# Patient Record
Sex: Female | Born: 1935 | Race: Black or African American | Hispanic: No | Smoking: Never smoker
Health system: Southern US, Community
[De-identification: ages and names within clinical notes are randomized; demographics above are authoritative.]

## PROBLEM LIST (undated history)

## (undated) DIAGNOSIS — E119 Type 2 diabetes mellitus without complications: Secondary | ICD-10-CM

## (undated) DIAGNOSIS — R569 Unspecified convulsions: Secondary | ICD-10-CM

## (undated) DIAGNOSIS — I10 Essential (primary) hypertension: Secondary | ICD-10-CM

## (undated) DIAGNOSIS — I639 Cerebral infarction, unspecified: Secondary | ICD-10-CM

## (undated) DIAGNOSIS — G8324 Monoplegia of upper limb affecting left nondominant side: Secondary | ICD-10-CM

---

## 2013-08-07 ENCOUNTER — Emergency Department (HOSPITAL_COMMUNITY): Payer: Medicare Other

## 2013-08-07 ENCOUNTER — Emergency Department (HOSPITAL_COMMUNITY)
Admission: EM | Admit: 2013-08-07 | Discharge: 2013-08-07 | Disposition: A | Discharge status: Home / Self Care | Payer: Medicare Other | Attending: Emergency Medicine | Admitting: Emergency Medicine

## 2013-08-07 ENCOUNTER — Encounter (HOSPITAL_COMMUNITY): Payer: Self-pay | Admitting: Emergency Medicine

## 2013-08-07 DIAGNOSIS — R569 Unspecified convulsions: Secondary | ICD-10-CM | POA: Diagnosis present

## 2013-08-07 DIAGNOSIS — Z8669 Personal history of other diseases of the nervous system and sense organs: Secondary | ICD-10-CM | POA: Insufficient documentation

## 2013-08-07 DIAGNOSIS — I10 Essential (primary) hypertension: Secondary | ICD-10-CM | POA: Diagnosis not present

## 2013-08-07 DIAGNOSIS — N390 Urinary tract infection, site not specified: Secondary | ICD-10-CM | POA: Insufficient documentation

## 2013-08-07 DIAGNOSIS — Z8673 Personal history of transient ischemic attack (TIA), and cerebral infarction without residual deficits: Secondary | ICD-10-CM | POA: Diagnosis not present

## 2013-08-07 DIAGNOSIS — E119 Type 2 diabetes mellitus without complications: Secondary | ICD-10-CM | POA: Diagnosis not present

## 2013-08-07 HISTORY — DX: Type 2 diabetes mellitus without complications: E11.9

## 2013-08-07 HISTORY — DX: Monoplegia of upper limb affecting left nondominant side: G83.24

## 2013-08-07 HISTORY — DX: Cerebral infarction, unspecified: I63.9

## 2013-08-07 HISTORY — DX: Unspecified convulsions: R56.9

## 2013-08-07 HISTORY — DX: Essential (primary) hypertension: I10

## 2013-08-07 LAB — URINALYSIS, ROUTINE W REFLEX MICROSCOPIC
BILIRUBIN URINE: NEGATIVE
Glucose, UA: NEGATIVE mg/dL
Hgb urine dipstick: NEGATIVE
KETONES UR: NEGATIVE mg/dL
Leukocytes, UA: NEGATIVE
Nitrite: POSITIVE — AB
Protein, ur: NEGATIVE mg/dL
Specific Gravity, Urine: 1.02 (ref 1.005–1.030)
UROBILINOGEN UA: 0.2 mg/dL (ref 0.0–1.0)
pH: 6.5 (ref 5.0–8.0)

## 2013-08-07 LAB — CBC WITH DIFFERENTIAL/PLATELET
Basophils Absolute: 0 10*3/uL (ref 0.0–0.1)
Basophils Relative: 0 % (ref 0–1)
Eosinophils Absolute: 0 10*3/uL (ref 0.0–0.7)
Eosinophils Relative: 0 % (ref 0–5)
HEMATOCRIT: 35.4 % — AB (ref 36.0–46.0)
HEMOGLOBIN: 11.6 g/dL — AB (ref 12.0–15.0)
Lymphocytes Relative: 20 % (ref 12–46)
Lymphs Abs: 0.7 10*3/uL (ref 0.7–4.0)
MCH: 28.6 pg (ref 26.0–34.0)
MCHC: 32.8 g/dL (ref 30.0–36.0)
MCV: 87.2 fL (ref 78.0–100.0)
MONOS PCT: 8 % (ref 3–12)
Monocytes Absolute: 0.3 10*3/uL (ref 0.1–1.0)
NEUTROS ABS: 2.6 10*3/uL (ref 1.7–7.7)
NEUTROS PCT: 72 % (ref 43–77)
Platelets: 149 10*3/uL — ABNORMAL LOW (ref 150–400)
RBC: 4.06 MIL/uL (ref 3.87–5.11)
RDW: 15.2 % (ref 11.5–15.5)
WBC: 3.7 10*3/uL — ABNORMAL LOW (ref 4.0–10.5)

## 2013-08-07 LAB — COMPREHENSIVE METABOLIC PANEL
ALT: 13 U/L (ref 0–35)
ANION GAP: 9 (ref 5–15)
AST: 20 U/L (ref 0–37)
Albumin: 2.8 g/dL — ABNORMAL LOW (ref 3.5–5.2)
Alkaline Phosphatase: 84 U/L (ref 39–117)
BUN: 19 mg/dL (ref 6–23)
CALCIUM: 8.4 mg/dL (ref 8.4–10.5)
CO2: 27 mEq/L (ref 19–32)
Chloride: 110 mEq/L (ref 96–112)
Creatinine, Ser: 0.51 mg/dL (ref 0.50–1.10)
GFR calc non Af Amer: 89 mL/min — ABNORMAL LOW (ref >90)
GLUCOSE: 89 mg/dL (ref 70–99)
Potassium: 3.9 mEq/L (ref 3.7–5.3)
SODIUM: 146 meq/L (ref 137–147)
TOTAL PROTEIN: 6.8 g/dL (ref 6.0–8.3)
Total Bilirubin: <0.2 mg/dL — ABNORMAL LOW (ref 0.3–1.2)

## 2013-08-07 LAB — PROTIME-INR
INR: 1.15 (ref 0.00–1.49)
PROTHROMBIN TIME: 14.7 s (ref 11.6–15.2)

## 2013-08-07 LAB — LACTIC ACID, PLASMA: Lactic Acid, Venous: 1.1 mmol/L (ref 0.5–2.2)

## 2013-08-07 LAB — URINE MICROSCOPIC-ADD ON

## 2013-08-07 MED ORDER — SODIUM CHLORIDE 0.9 % IV SOLN
1000.0000 mL | Freq: Once | INTRAVENOUS | Status: AC
Start: 2013-08-07 — End: 2013-08-07
  Administered 2013-08-07: 1000 mL via INTRAVENOUS

## 2013-08-07 MED ORDER — SODIUM CHLORIDE 0.9 % IV SOLN
1000.0000 mL | INTRAVENOUS | Status: DC
  Administered 2013-08-07: 1000 mL via INTRAVENOUS

## 2013-08-07 MED ORDER — DEXTROSE 5 % IV SOLN
1.0000 g | Freq: Once | INTRAVENOUS | Status: AC
  Administered 2013-08-07: 1 g via INTRAVENOUS
  Filled 2013-08-07: qty 10

## 2013-08-07 MED ORDER — CEPHALEXIN 500 MG PO CAPS: 500.0000 mg | ORAL_CAPSULE | Freq: Two times a day (BID) | ORAL | Status: AC

## 2013-08-07 NOTE — ED Notes (Signed)
Pt had witnessed seizure at funeral today per family. Pt post ictal upon arrival to ED.

## 2013-08-07 NOTE — ED Notes (Signed)
Pt niece, Alberteen SpindleMargie Williamson [phone].  Pt niece, Johnsie KindredMattie Beckham [phone].

## 2013-08-07 NOTE — Discharge Instructions (Signed)
As discussed, it is important that you follow up as soon as possible with your physician for continued management of your condition. ° °If you develop any new, or concerning changes in your condition, please return to the emergency department immediately. ° °

## 2013-08-07 NOTE — ED Provider Notes (Signed)
CSN: [id number]     Arrival date & time 08/07/13  1559 History   First MD Initiated Contact with Patient 08/07/13 1619    This chart was scribed for Gerhard Munchobert Calil Amor, MD by Marica OtterNusrat Rahman, ED Scribe. This patient was seen in room [facility]18/[facility]18 and the patient's care was started at 4:30 PM.   LEVEL 5 CAVEAT: POSTICTAL STATE Chief Complaint  Patient presents with  . Seizures   [place]  The history is provided by a relative. No language interpreter was used.   HPI Comments: Jaime Bradley is a 80 y.o. female, with a medical Hx significant for stroke (with left sided weakness), DM, HTN, seizure, and paralysis of LUE, who presents to the Emergency Department complaining of 2 seizures that took place earlier today. Pt is postictal at time of examination.   Per relatives, pt attended her sister's funeral today. Relative reports that pt had her first seizure while getting ready to attend the funeral and her second seizure while at the funeral. Relatives deny any head trauma during either one of the episodes.   Pt's relatives report that she has a Hx of seizures, with the last seizure taking place in January of 2015. Relatives note, however, that she is not on any epilepsy meds. Relatives further note that pt has been using a wheelchair at baseline for the past 5 years. Relatives note prior to her 2 epileptic episodes today, she was at baseline and they note that she ate breakfast this morning.    Past Medical History  Diagnosis Date  . Stroke   . Hypertension   . Diabetes mellitus without complication   . Seizure   . Paralysis of left upper extremity    History reviewed. No pertinent past surgical history. No family history on file. History  Substance Use Topics  . Smoking status: Never Smoker   . Smokeless tobacco: Not on file  . Alcohol Use: No   OB History   Grav Para Term Preterm Abortions TAB SAB Ect Mult Living                 Review of Systems  Unable to  perform ROS: Acuity of condition      Allergies  Review of patient's allergies indicates no known allergies.  Home Medications   Prior to Admission medications   Not on File   Triage Vitals: BP 167/105  Pulse 96  Temp(Src) 97.8 F (36.6 C) (Oral)  Resp 20  SpO2 98% Physical Exam  Nursing note and vitals reviewed. Constitutional: She appears well-developed and well-nourished.  HENT:  Head: Normocephalic and atraumatic.  Eyes: Conjunctivae and EOM are normal.  Cardiovascular: Normal rate and regular rhythm.   Pulmonary/Chest: Effort normal and breath sounds normal. No stridor. No respiratory distress.  Abdominal: She exhibits no distension. There is no tenderness.  Musculoskeletal: She exhibits no edema.  Neurological:  Patient is initially postictal, not responding. Subsequently the patient is awake, alert, interactive, appropriately according to family members. Patient has lower extremity paresis, unchanged from prior stroke  Skin: Skin is warm and dry.  Psychiatric: Cognition and memory are impaired.    ED Course  Procedures (including critical care time) 6:30PM: Recheck: pt is resting comfortably, discussed UA results with pt.   6:59PM: Discussion with pt's family regarding lab results, starting antibiotics and reasons for holding off on epilepsy meds for now. Patient's family verbalizes understanding and agrees with treatment plan. Pt's family further advised to f/u with PCP as soon  as possible.   Labs Review Labs Reviewed  CBC WITH DIFFERENTIAL - Abnormal; Notable for the following:    WBC 3.7 (*)    Hemoglobin 11.6 (*)    HCT 35.4 (*)    Platelets 149 (*)    All other components within normal limits  COMPREHENSIVE METABOLIC PANEL - Abnormal; Notable for the following:    Albumin 2.8 (*)    Total Bilirubin <0.2 (*)    GFR calc non Af Amer 89 (*)    All other components within normal limits  URINALYSIS, ROUTINE W REFLEX MICROSCOPIC - Abnormal; Notable for the  following:    APPearance HAZY (*)    Nitrite POSITIVE (*)    All other components within normal limits  URINE MICROSCOPIC-ADD ON - Abnormal; Notable for the following:    Bacteria, UA MANY (*)    All other components within normal limits  LACTIC ACID, PLASMA  PROTIME-INR    Imaging Review Dg Chest 1 View  08/07/2013   CLINICAL DATA:  Weakness and seizures  EXAM: CHEST - 1 VIEW  COMPARISON:  None.  FINDINGS: The lungs are borderline hypoinflated but clear. The heart and mediastinal structures are within the limits of normal. There is mild tortuosity of the descending thoracic aorta. There is levoscoliosis of the thoracolumbar spine which may be positional.  IMPRESSION: There is no acute cardiopulmonary abnormality.   Electronically Signed   By: David  Swazil[place]   On: 08/07/2013 17:44   Ct Head Wo Contrast  08/07/2013   CLINICAL DATA:  Altered level of consciousness  EXAM: CT HEAD WITHOUT CONTRAST  TECHNIQUE: Contiguous axial images were obtained from the base of the skull through the vertex without intravenous contrast.  COMPARISON:  None.  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 There is chronic infarction with atrophy and encephalomalacia in the right middle cerebral artery territory. There is ex vacuo enlargement of the right lateral ventricle related to this. No sign of acute infarction, mass lesion, hemorrhage, hydrocephalus or extra-axial collection. No acute calvarial finding. Sinuses are clear.  IMPRESSION: Previous right-sided craniotomy for aneurysm clipping. Chronic infarction in the right middle cerebral artery territory. No acute finding.   Electronically Signed   By: Paulina FusiMark  Shogry M.D.   On: 08/07/2013 17:20     EKG Interpretation   Date/Time:  Friday August 07 2013 16:05:38 EDT Ventricular Rate:  93 PR Interval:    QRS Duration: 85 QT Interval:  472 QTC Calculation: 587 R Axis:   -3 Text  Interpretation:  Atrial flutter with predominant 3:1 AV block  Nonspecific T abnormalities, lateral leads Prolonged QT interval Artifact  Abnormal ekg Confirmed by Gerhard MunchLOCKWOOD, Haven Pylant  MD ([id number]) on 08/07/2013 5:29:49  PM     8:24 PM Patient asleep MDM  This patient with a history of seizures presents after 2 witnessed seizures. initially the patient is  post ictal, but sherecovers to baseline. Patient's evaluation demonstrated the presence of a urinary tract infection. Given this is a likely precipitant for her seizures, she will not be started on additional antiepileptics, but will follow up with her primary care physician upon returning home to discuss both seizure episodes today, and consideration of additional antiepileptic medication.  I personally performed the services described in this documentation, which was scribed in my presence. The recorded information has been reviewed and is accurate.      Gerhard Munchobert Ronney [employer], MD 08/07/13 2026

## 2014-12-12 IMAGING — CR DG CHEST 1V
1 series · 1 of 1 positions shown · non-contrast
Comparison: None.

CLINICAL DATA: Weakness and seizures

EXAM:
CHEST - 1 VIEW

[view not recorded]
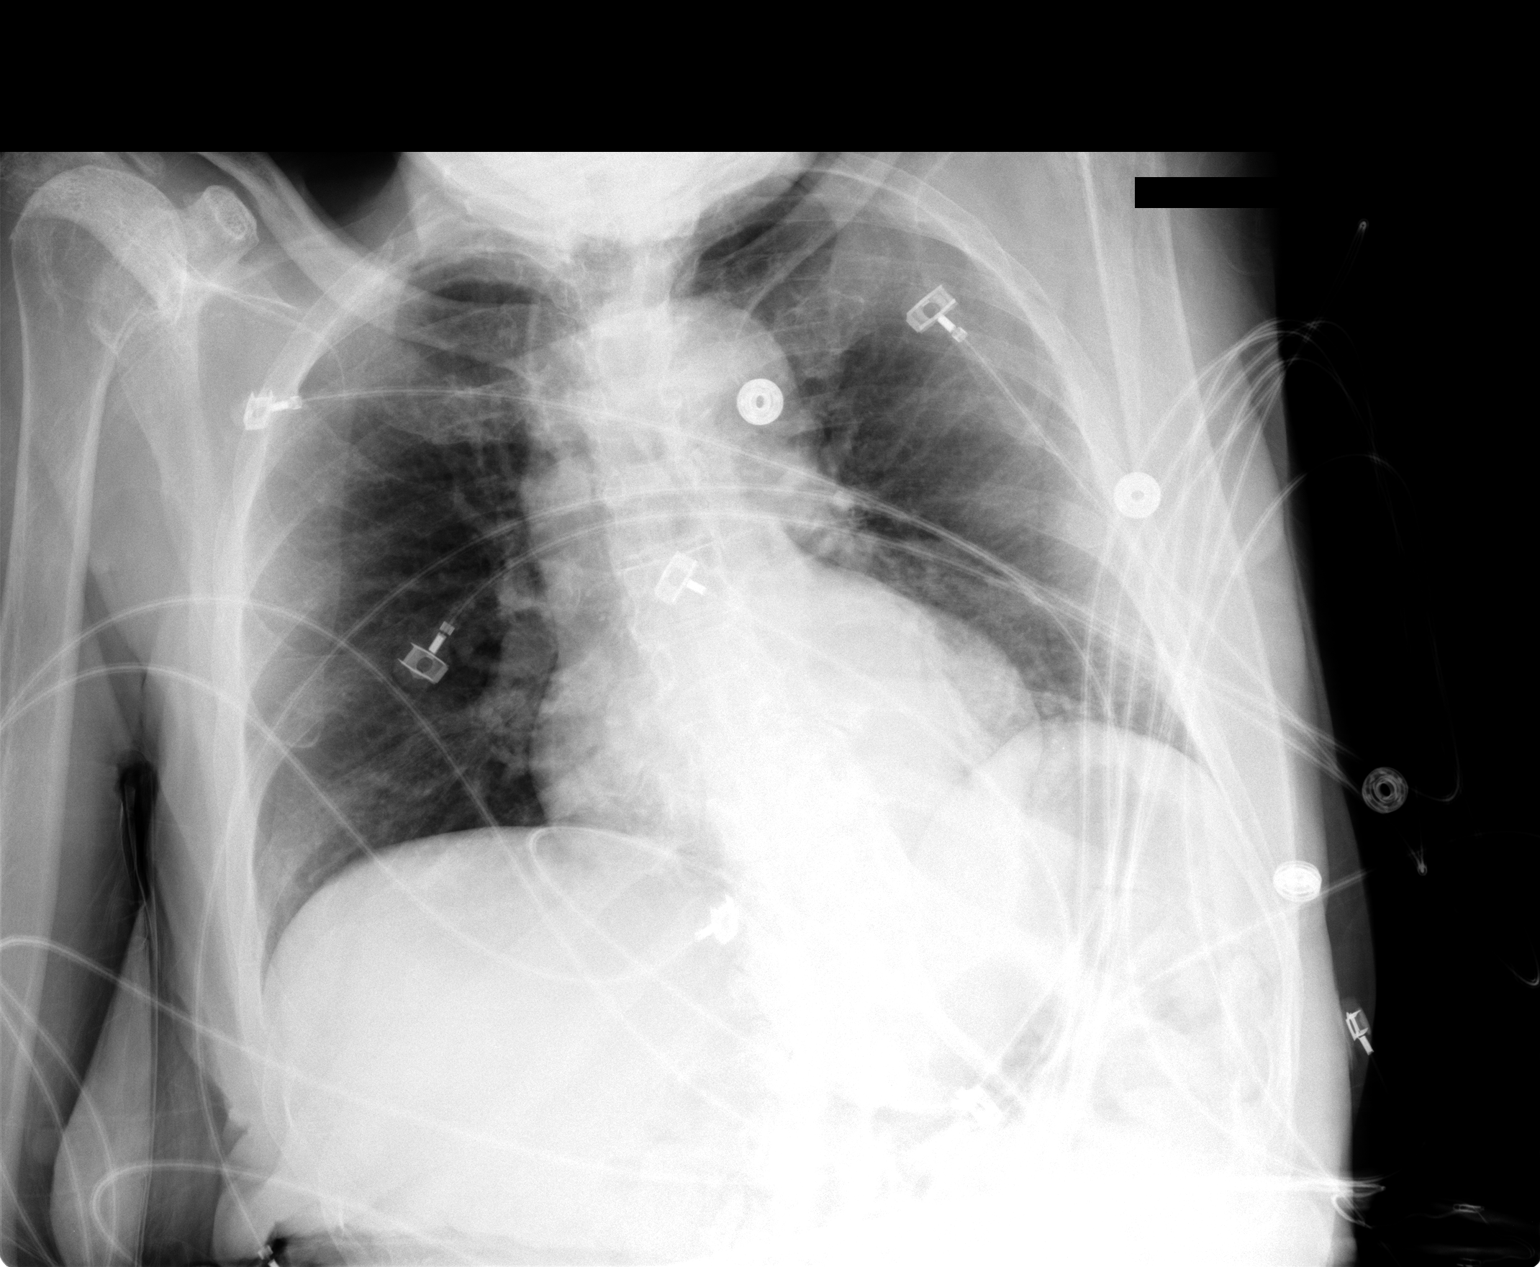

[1 of 1 positions shown; findings below may reference images not displayed]

FINDINGS: The lungs are borderline hypoinflated but clear. The heart and
mediastinal structures are within the limits of normal. There is
mild tortuosity of the descending thoracic aorta. There is
levoscoliosis of the thoracolumbar spine which may be positional.
IMPRESSION: There is no acute cardiopulmonary abnormality.

## 2021-01-05 DEATH — deceased
